# Patient Record
Sex: Female | Born: 1990
Health system: Southern US, Community
[De-identification: ages and names within clinical notes are randomized; demographics above are authoritative.]

## PROBLEM LIST (undated history)

## (undated) DIAGNOSIS — F988 Other specified behavioral and emotional disorders with onset usually occurring in childhood and adolescence: Secondary | ICD-10-CM

## (undated) DIAGNOSIS — K589 Irritable bowel syndrome without diarrhea: Secondary | ICD-10-CM

## (undated) DIAGNOSIS — F320 Major depressive disorder, single episode, mild: Secondary | ICD-10-CM

## (undated) DIAGNOSIS — Z23 Encounter for immunization: Secondary | ICD-10-CM

## (undated) DIAGNOSIS — F32A Depression, unspecified: Secondary | ICD-10-CM

## (undated) DIAGNOSIS — L709 Acne, unspecified: Secondary | ICD-10-CM

## (undated) HISTORY — DX: Acne, unspecified: L70.9

## (undated) HISTORY — DX: Irritable bowel syndrome, unspecified: K58.9

## (undated) HISTORY — DX: Encounter for immunization: Z23

## (undated) HISTORY — DX: Major depressive disorder, single episode, mild: F32.0

## (undated) HISTORY — DX: Other specified behavioral and emotional disorders with onset usually occurring in childhood and adolescence: F98.8

## (undated) HISTORY — DX: Depression, unspecified: F32.A

---

## 2013-05-19 ENCOUNTER — Ambulatory Visit: Payer: Self-pay | Admitting: Family Medicine

## 2014-08-01 IMAGING — US US ABDOMEN LIMITED SLG ORGAN/ASCITES
1 series · 14 of 25 positions shown · non-contrast
Comparison: none

REASON FOR EXAM: gallbladder   liver     abd pain  diarrhea
COMMENTS:

[Series 1: us abdomen limited slg organ/ascites · 0.23mm/px · 14 of 50 slices shown]
[im 1/50]
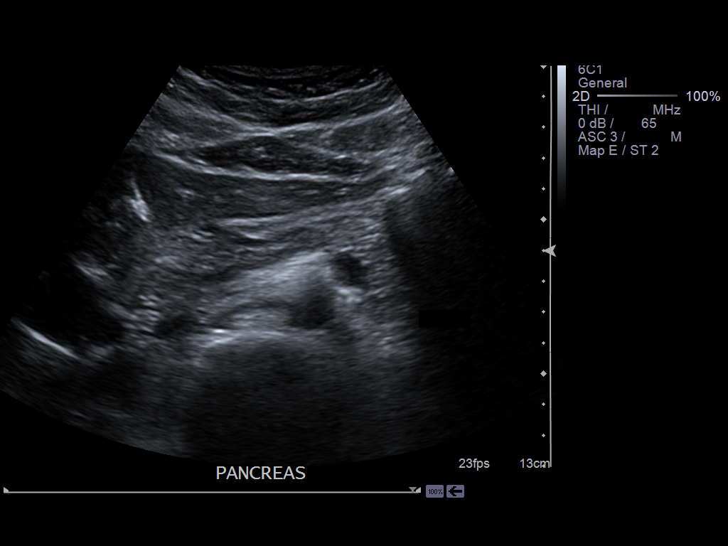
[im 5/50]
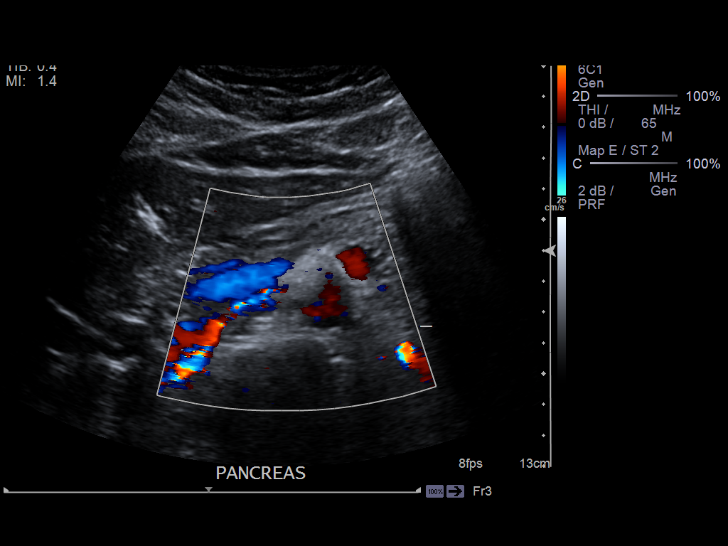
[im 9/50]
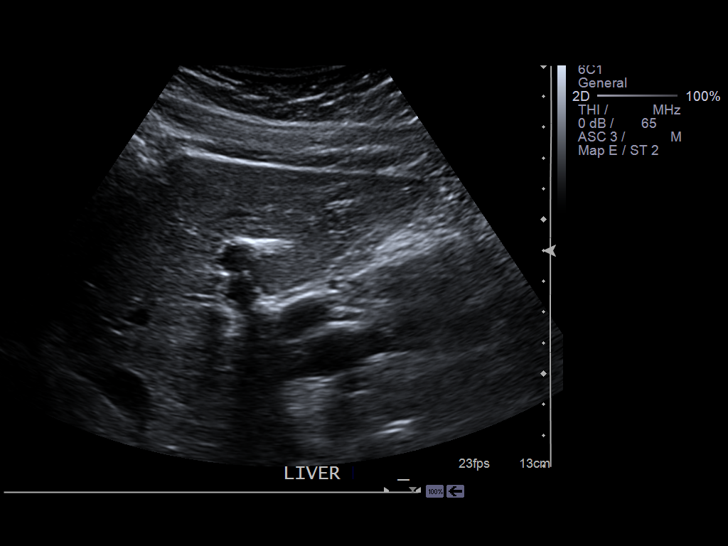
[im 13/50]
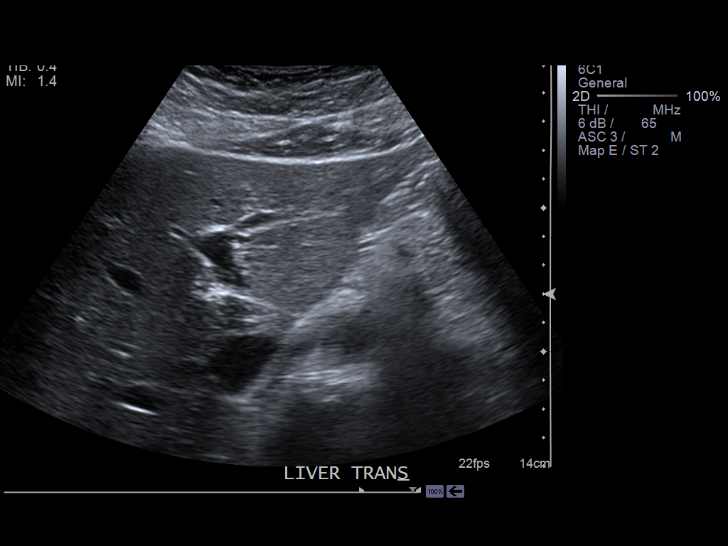
[im 17/50]
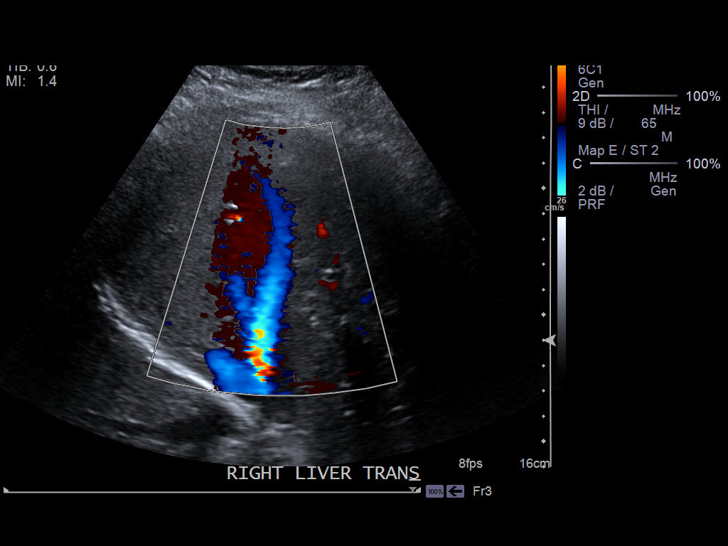
[im 19/50]
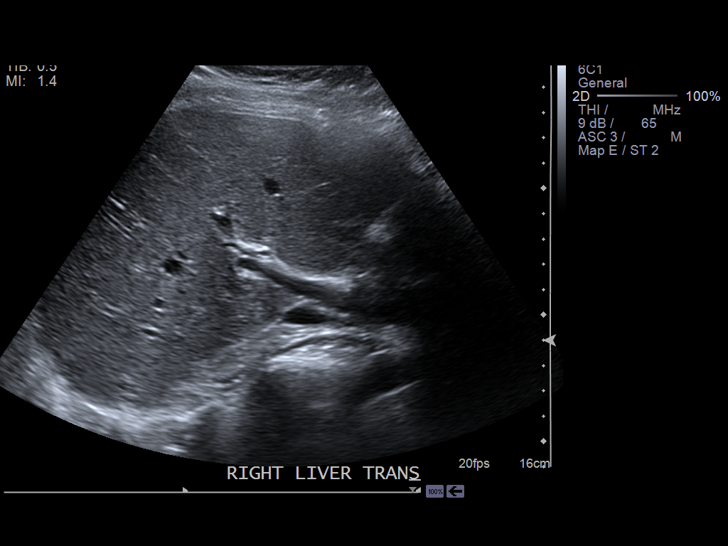
[im 23/50]
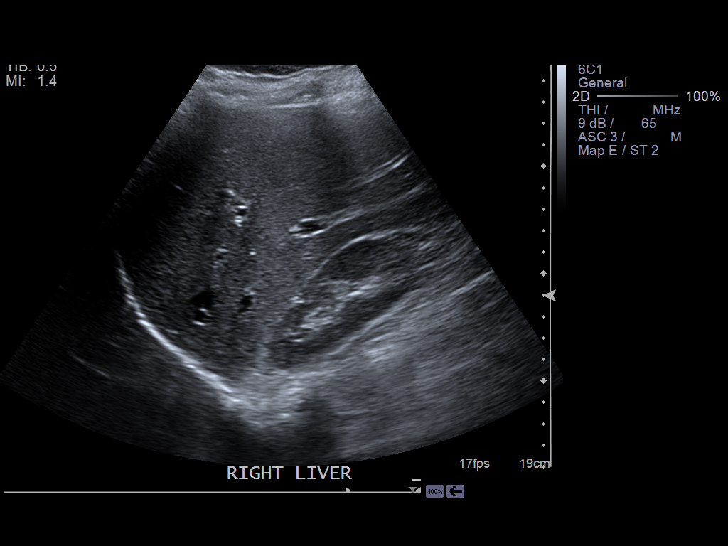
[im 27/50]
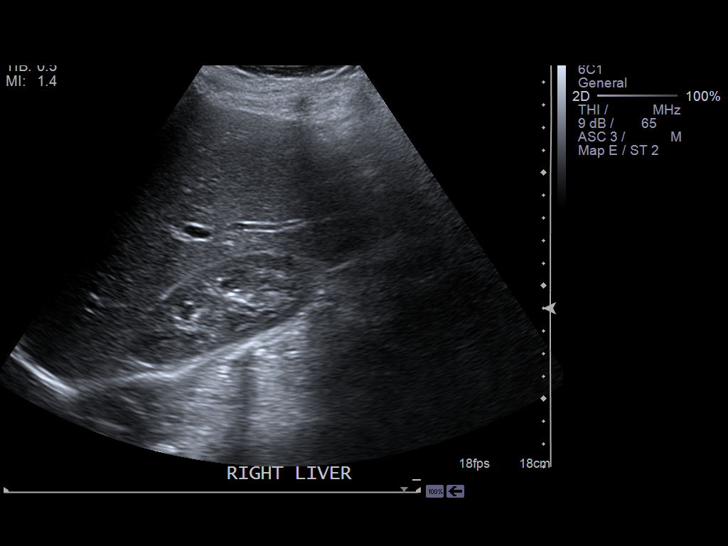
[im 31/50]
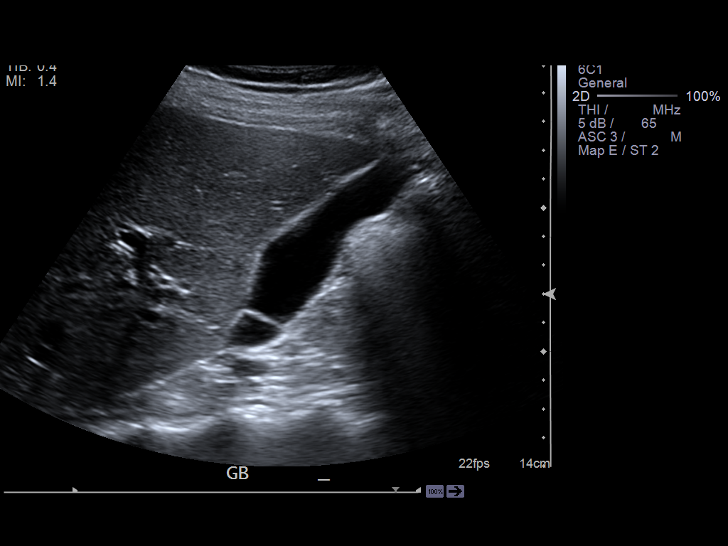
[im 33/50]
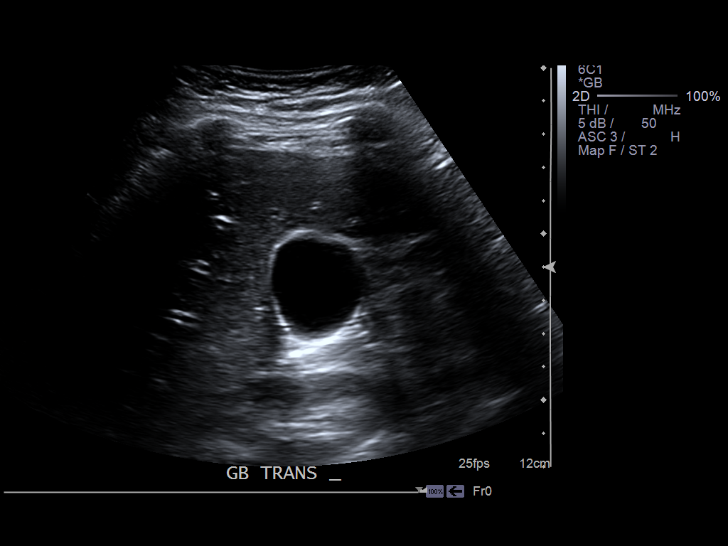
[im 37/50]
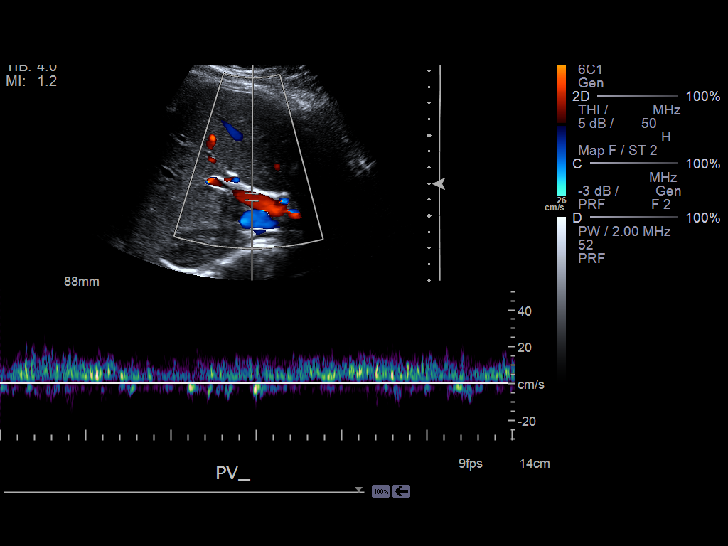
[im 41/50]
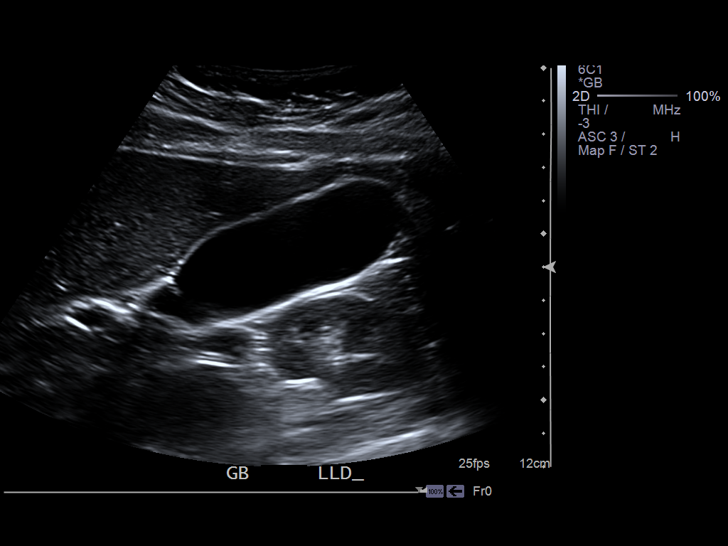
[im 45/50]
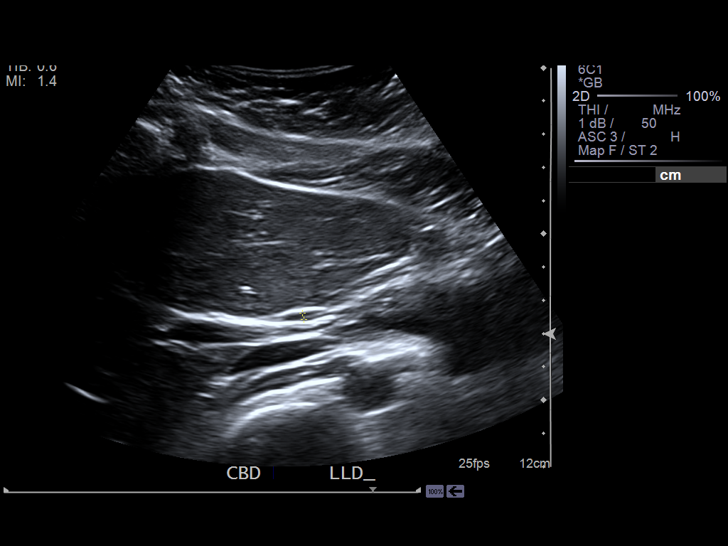
[im 50/50]
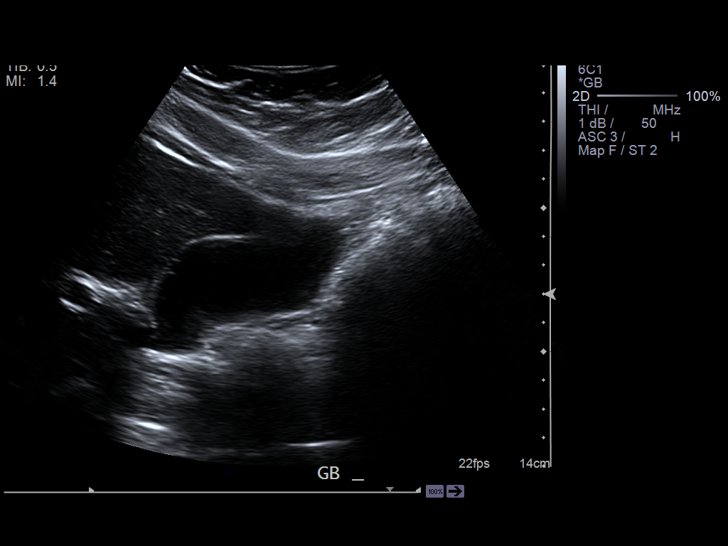

[14 of 25 positions shown; findings below may reference images not displayed]

PROCEDURE:     AMNERIS - AMNERIS ABDOMEN LTD 1 ORGAN OR QUAD  - May 19, 2013  [DATE]

RESULT:     Limited right upper quadrant abdominal sonogram shows the
visualized portions of the pancreas appear normal. The head is not well
seen. The liver echotexture appears to be normal. There is no intrahepatic
biliary ductal dilation. Liver length is 16.71 cm. The gallbladder wall is
2.1 mm thick. There is a negative sonographic Murphy's sign. There is no
cholelithiasis. The common bile duct diameter is 2.5 mm. Portal venous flow
is normal.
IMPRESSION: 1. Limited visualization of the pancreas as described. Otherwise, normal
appearing limited right upper quadrant abdominal sonogram.

[REDACTED]

## 2016-09-16 DIAGNOSIS — J020 Streptococcal pharyngitis: Secondary | ICD-10-CM | POA: Diagnosis not present

## 2016-09-16 DIAGNOSIS — J029 Acute pharyngitis, unspecified: Secondary | ICD-10-CM | POA: Diagnosis not present

## 2016-12-16 DIAGNOSIS — R51 Headache: Secondary | ICD-10-CM | POA: Diagnosis not present

## 2016-12-16 DIAGNOSIS — R42 Dizziness and giddiness: Secondary | ICD-10-CM | POA: Diagnosis not present

## 2016-12-17 DIAGNOSIS — R42 Dizziness and giddiness: Secondary | ICD-10-CM | POA: Diagnosis not present

## 2017-07-03 DIAGNOSIS — J020 Streptococcal pharyngitis: Secondary | ICD-10-CM | POA: Diagnosis not present

## 2018-07-10 HISTORY — PX: OTHER SURGICAL HISTORY: SHX169

## 2018-08-04 ENCOUNTER — Encounter: Payer: Self-pay | Admitting: Obstetrics and Gynecology

## 2018-08-04 ENCOUNTER — Ambulatory Visit (INDEPENDENT_AMBULATORY_CARE_PROVIDER_SITE_OTHER): Payer: 59 | Admitting: Obstetrics and Gynecology

## 2018-08-04 ENCOUNTER — Ambulatory Visit (INDEPENDENT_AMBULATORY_CARE_PROVIDER_SITE_OTHER): Payer: 59

## 2018-08-04 ENCOUNTER — Ambulatory Visit (INDEPENDENT_AMBULATORY_CARE_PROVIDER_SITE_OTHER): Payer: 59 | Admitting: Certified Nurse Midwife

## 2018-08-04 VITALS — BP 120/80 | HR 102 | Ht 67.0 in | Wt 197.0 lb

## 2018-08-04 DIAGNOSIS — O3481 Maternal care for other abnormalities of pelvic organs, first trimester: Secondary | ICD-10-CM | POA: Diagnosis not present

## 2018-08-04 DIAGNOSIS — Z3A01 Less than 8 weeks gestation of pregnancy: Secondary | ICD-10-CM | POA: Diagnosis not present

## 2018-08-04 DIAGNOSIS — O26851 Spotting complicating pregnancy, first trimester: Secondary | ICD-10-CM | POA: Diagnosis not present

## 2018-08-04 DIAGNOSIS — O209 Hemorrhage in early pregnancy, unspecified: Secondary | ICD-10-CM | POA: Diagnosis not present

## 2018-08-04 DIAGNOSIS — N8312 Corpus luteum cyst of left ovary: Secondary | ICD-10-CM | POA: Diagnosis not present

## 2018-08-04 NOTE — Patient Instructions (Signed)
I value your feedback and entrusting us with your care. If you get a [facility] patient survey, I would appreciate you taking the time to let us know about your experience today. Thank you! 

## 2018-08-04 NOTE — Progress Notes (Signed)
    Pt presents for annual exam but has had several pos UPTs at home. LMP 06/22/18. EDD is 6 wks. Pt started having dark spotting today. Has had mild cramping since LMP and it continues today. Cramping is still mild. Pt is not on BC, hasn't been using condoms. Was considering terminating pregnancy anyway and had appt tomorrow, but it was rescheduled for Mon, Dec. 30.  Pt is G0P0.  Check labs today, sched OB u/s this afternoon. Pt to f/u with CLG afterwards.

## 2018-08-04 NOTE — Progress Notes (Signed)
  HPI: 27 year old G1 P0 with LMP 06/22/2018 seen today by PA Copland and she reported being pregnant and having spotting and cramping. An ultrasound was ordered and she presents for the findings.    Ultrasound demonstrates a viable 5wk 6d intauterine pregnancy with FCA=108. There is a small SCH measuring 2.1 x 1.3 x 1.4 cm. The adnexal structures are normal appearing.  She reports that her blood type is O POS, and labs have been drawn to confirm.  PMHx: She  has no past medical history on file. Also,  has no past surgical history on file., family history includes Heart attack in her paternal grandfather; Hypercholesterolemia in her father; Hypertension in her father.,  reports that she has quit smoking. She has never used smokeless tobacco. She reports previous alcohol use. She reports previous drug use.  She currently has no medications in their medication list. Also, has No Known Allergies.  ROS  Objective: Vital Signs: See PA Copland's note Constitutional NAD, Conversant  Skin No rashes, lesions or ulceration.   Extremities: Moves all appropriately.  Neuro: Grossly intact  Psych: Oriented to PPT.  Normal mood. Normal affect.   Assessment:  Viable 5wk 6 day IUP Spotting in first trimester  Plan: Discussed with patient the possibility of miscarriage with first trimester spotting, but that many pregnancies continue on until term.  She is contemplating termination next week.  Will call tomorrow if she is RH negative and needs Rhogam.  Farrel Connersolleen Anden Bartolo, CNM

## 2018-08-05 LAB — ABO/RH: RH TYPE: POSITIVE

## 2018-08-05 LAB — BETA HCG QUANT (REF LAB): hCG Quant: 15884 m[IU]/mL

## 2018-10-19 ENCOUNTER — Ambulatory Visit: Payer: 59 | Admitting: Obstetrics and Gynecology

## 2018-10-25 NOTE — Patient Instructions (Signed)
I value your feedback and entrusting us with your care. If you get a [facility] patient survey, I would appreciate you taking the time to let us know about your experience today. Thank you! 

## 2018-10-25 NOTE — Progress Notes (Signed)
PCP:  Patient, No Pcp Per   Chief Complaint  Patient presents with  . Gynecologic Exam  . Contraception    maybe going back to pills     HPI:      Christina Hutchinson is a 28 y.o. G0P0000 who LMP was Patient's last menstrual period was 10/10/2018 (exact date)., presents today for her annual examination.  Her menses are regular every 28-30 days, lasting 7 days. Dysmenorrhea mild, occurring first 1-2 days of flow. She does not have intermenstrual bleeding.  Sex activity: single partner, contraception - condoms. Would like to restart OCPs. Did Garnette Scheuermann in past. Pt had unintended pregnancy 12/19 with termination. Last Pap: December 03, 2015  Results were: no abnormalities  Hx of STDs: none  There is no FH of breast cancer. There is no FH of ovarian cancer. The patient does do self-breast exams.  Tobacco use: The patient denies current or previous tobacco use. Alcohol use: small amt daily (works at [employer]) No drug use.  Exercise: moderately active  She does get adequate calcium and Vitamin D in her diet. Gardasil completed.  Having increased acne for past yr. Hoping OCPs will improve sx.  Past Medical History:  Diagnosis Date  . Acne   . ADD (attention deficit disorder)   . IBS (irritable bowel syndrome)   . Mild depression (HCC)   . Vaccine for human papilloma virus (HPV) types 6, 11, 16, and 18 administered     Past Surgical History:  Procedure Laterality Date  . elective abortion  07/2018    Family History  Problem Relation Age of Onset  . Hypertension Father   . Hypercholesterolemia Father   . Hyperlipidemia Father   . Heart attack Paternal Grandfather   . Kidney cancer Other 71    Social History   Socioeconomic History  . Marital status: Single    Spouse name: Not on file  . Number of children: Not on file  . Years of education: Not on file  . Highest education level: Not on file  Occupational History  . Not on file  Social Needs  . Financial resource  strain: Not on file  . Food insecurity:    Worry: Not on file    Inability: Not on file  . Transportation needs:    Medical: Not on file    Non-medical: Not on file  Tobacco Use  . Smoking status: Former Games developer  . Smokeless tobacco: Never Used  Substance and Sexual Activity  . Alcohol use: Yes  . Drug use: Not Currently  . Sexual activity: Yes    Birth control/protection: None  Lifestyle  . Physical activity:    Days per week: Not on file    Minutes per session: Not on file  . Stress: Not on file  Relationships  . Social connections:    Talks on phone: Not on file    Gets together: Not on file    Attends religious service: Not on file    Active member of club or organization: Not on file    Attends meetings of clubs or organizations: Not on file    Relationship status: Not on file  . Intimate partner violence:    Fear of current or ex partner: Not on file    Emotionally abused: Not on file    Physically abused: Not on file    Forced sexual activity: Not on file  Other Topics Concern  . Not on file  Social History Narrative  .  Not on file    No outpatient medications prior to visit.   No facility-administered medications prior to visit.     ROS:  Review of Systems  Constitutional: Negative for fatigue, fever and unexpected weight change.  Respiratory: Negative for cough, shortness of breath and wheezing.   Cardiovascular: Negative for chest pain, palpitations and leg swelling.  Gastrointestinal: Negative for blood in stool, constipation, diarrhea, nausea and vomiting.  Endocrine: Negative for cold intolerance, heat intolerance and polyuria.  Genitourinary: Negative for dyspareunia, dysuria, flank pain, frequency, genital sores, hematuria, menstrual problem, pelvic pain, urgency, vaginal bleeding, vaginal discharge and vaginal pain.  Musculoskeletal: Negative for back pain, joint swelling and myalgias.  Skin: Negative for rash.  Neurological: Negative for  dizziness, syncope, light-headedness, numbness and headaches.  Hematological: Negative for adenopathy.  Psychiatric/Behavioral: Negative for agitation, confusion, sleep disturbance and suicidal ideas. The patient is not nervous/anxious.    BREAST: No symptoms   Objective: BP 120/80   Pulse 73   Ht 5\' 7"  (1.702 m)   Wt 197 lb (89.4 kg)   LMP 10/10/2018 (Exact Date)   BMI 30.85 kg/m    Physical Exam Constitutional:      Appearance: She is well-developed.  Genitourinary:     Vulva, vagina, cervix, uterus, right adnexa and left adnexa normal.     No vulval lesion or tenderness noted.     No vaginal discharge, erythema or tenderness.     No cervical polyp.     Uterus is not enlarged or tender.     No right or left adnexal mass present.     Right adnexa not tender.     Left adnexa not tender.  Neck:     Musculoskeletal: Normal range of motion.     Thyroid: No thyromegaly.  Cardiovascular:     Rate and Rhythm: Normal rate and regular rhythm.     Heart sounds: Normal heart sounds. No murmur.  Pulmonary:     Effort: Pulmonary effort is normal.     Breath sounds: Normal breath sounds.  Chest:     Breasts:        Right: No mass, nipple discharge, skin change or tenderness.        Left: No mass, nipple discharge, skin change or tenderness.  Abdominal:     Palpations: Abdomen is soft.     Tenderness: There is no abdominal tenderness. There is no guarding.  Musculoskeletal: Normal range of motion.  Neurological:     General: No focal deficit present.     Mental Status: She is alert and oriented to person, place, and time.     Cranial Nerves: No cranial nerve deficit.  Skin:    General: Skin is warm and dry.  Psychiatric:        Mood and Affect: Mood normal.        Behavior: Behavior normal.        Thought Content: Thought content normal.        Judgment: Judgment normal.  Vitals signs reviewed.     Assessment/Plan: Encounter for annual routine gynecological examination   Cervical cancer screening - Plan: Cytology - PAP  Screening for STD (sexually transmitted disease) - Plan: Cytology - PAP  Encounter for initial prescription of contraceptive pills - OCP start with next menses. Condoms.  - Plan: desogestrel-ethinyl estradiol (KARIVA) 0.15-0.02/0.01 MG (21/5) tablet  Meds ordered this encounter  Medications  . desogestrel-ethinyl estradiol (KARIVA) 0.15-0.02/0.01 MG (21/5) tablet    Sig: Take 1 tablet by  mouth daily.    Dispense:  3 Package    Refill:  3    Order Specific Question:   Supervising Provider    Answer:   Nadara Mustard [[id number]]             GYN counsel adequate intake of calcium and vitamin D, diet and exercise     F/U  Return in about 1 year (around 10/26/2019).  Quinita Kostelecky B. Rossi Burdo, PA-C 10/26/2018 10:48 AM

## 2018-10-26 ENCOUNTER — Other Ambulatory Visit (HOSPITAL_COMMUNITY)
Admission: RE | Admit: 2018-10-26 | Discharge: 2018-10-26 | Disposition: A | Discharge status: Home / Self Care | Payer: BLUE CROSS/BLUE SHIELD | Source: Ambulatory Visit | Attending: Obstetrics and Gynecology | Admitting: Obstetrics and Gynecology

## 2018-10-26 ENCOUNTER — Ambulatory Visit (INDEPENDENT_AMBULATORY_CARE_PROVIDER_SITE_OTHER): Payer: BLUE CROSS/BLUE SHIELD | Admitting: Obstetrics and Gynecology

## 2018-10-26 ENCOUNTER — Encounter: Payer: Self-pay | Admitting: Obstetrics and Gynecology

## 2018-10-26 ENCOUNTER — Other Ambulatory Visit: Payer: Self-pay

## 2018-10-26 VITALS — BP 120/80 | HR 73 | Ht 67.0 in | Wt 197.0 lb

## 2018-10-26 DIAGNOSIS — Z01419 Encounter for gynecological examination (general) (routine) without abnormal findings: Secondary | ICD-10-CM

## 2018-10-26 DIAGNOSIS — Z113 Encounter for screening for infections with a predominantly sexual mode of transmission: Secondary | ICD-10-CM

## 2018-10-26 DIAGNOSIS — Z124 Encounter for screening for malignant neoplasm of cervix: Secondary | ICD-10-CM | POA: Diagnosis not present

## 2018-10-26 DIAGNOSIS — Z30011 Encounter for initial prescription of contraceptive pills: Secondary | ICD-10-CM

## 2018-10-26 MED ORDER — DESOGESTREL-ETHINYL ESTRADIOL 0.15-0.02/0.01 MG (21/5) PO TABS: 1.0000 | ORAL_TABLET | Freq: Every day | ORAL | 3 refills | Status: DC

## 2018-10-27 LAB — CYTOLOGY - PAP
Chlamydia: NEGATIVE
Diagnosis: NEGATIVE
Neisseria Gonorrhea: NEGATIVE

## 2019-10-09 ENCOUNTER — Other Ambulatory Visit: Payer: Self-pay | Admitting: Obstetrics and Gynecology

## 2019-10-09 DIAGNOSIS — Z30011 Encounter for initial prescription of contraceptive pills: Secondary | ICD-10-CM

## 2019-10-18 ENCOUNTER — Other Ambulatory Visit: Payer: Self-pay | Admitting: Obstetrics and Gynecology

## 2019-10-18 DIAGNOSIS — Z30011 Encounter for initial prescription of contraceptive pills: Secondary | ICD-10-CM

## 2019-10-19 ENCOUNTER — Telehealth: Payer: Self-pay

## 2019-10-19 DIAGNOSIS — Z30011 Encounter for initial prescription of contraceptive pills: Secondary | ICD-10-CM

## 2019-10-19 NOTE — Telephone Encounter (Signed)
Pt calling for refill of bc to [facility]; does not have ins right now and doesn't want to come in for annual now - just wants bc.  [phone]

## 2019-10-20 MED ORDER — DESOGESTREL-ETHINYL ESTRADIOL 0.15-0.02/0.01 MG (21/5) PO TABS: 1.0000 | ORAL_TABLET | Freq: Every day | ORAL | 0 refills | Status: DC

## 2019-10-20 NOTE — Telephone Encounter (Signed)
Left detailed msg for pt that refills have been sent in and she can contact [facility] at [phone] for annual and pap since no insurance.

## 2019-10-20 NOTE — Telephone Encounter (Signed)
Yes, send in 3 months. She can also go through [facility] for annual and pap since no insurance. Pls discuss with pt. Thx

## 2020-01-07 ENCOUNTER — Other Ambulatory Visit: Payer: Self-pay | Admitting: Obstetrics and Gynecology

## 2020-01-07 DIAGNOSIS — Z30011 Encounter for initial prescription of contraceptive pills: Secondary | ICD-10-CM

## 2020-01-14 ENCOUNTER — Other Ambulatory Visit: Payer: Self-pay | Admitting: Obstetrics and Gynecology

## 2020-01-14 DIAGNOSIS — Z30011 Encounter for initial prescription of contraceptive pills: Secondary | ICD-10-CM

## 2020-02-13 ENCOUNTER — Encounter: Payer: Self-pay | Admitting: Physician Assistant

## 2020-02-13 ENCOUNTER — Other Ambulatory Visit: Payer: Self-pay

## 2020-02-13 ENCOUNTER — Ambulatory Visit (LOCAL_COMMUNITY_HEALTH_CENTER): Payer: Self-pay | Admitting: Physician Assistant

## 2020-02-13 VITALS — BP 121/80 | Ht 68.5 in | Wt 206.0 lb

## 2020-02-13 DIAGNOSIS — Z3041 Encounter for surveillance of contraceptive pills: Secondary | ICD-10-CM

## 2020-02-13 DIAGNOSIS — Z0000 Encounter for general adult medical examination without abnormal findings: Secondary | ICD-10-CM

## 2020-02-13 DIAGNOSIS — Z3009 Encounter for other general counseling and advice on contraception: Secondary | ICD-10-CM

## 2020-02-13 MED ORDER — DESOGESTREL-ETHINYL ESTRADIOL 0.15-0.02/0.01 MG (21/5) PO TABS: 1.0000 | ORAL_TABLET | Freq: Every day | ORAL | 3 refills | Status: DC

## 2020-02-13 NOTE — Progress Notes (Signed)
Family Planning Visit- Initial Visit  Subjective:  Christina Hutchinson is a 29 y.o.  G1P0010   being seen today for an initial well woman visit and to discuss family planning options.  She is currently using OCs/COCs for pregnancy prevention. Patient reports she does not want a pregnancy in the next year.  Patient has the following medical conditions does not have a problem list on file.  Chief Complaint  Patient presents with  . Contraception    PE and OCs    Patient reports that she has used the same OC since she was 17 and never had any problems.  Wants to continue with this OC.  States that she does not currently have any insurance, so was not able to afford a visit with her Gyn.  States that she has had one partner for 6 years.  Reports that she takes MVI daily.  Patient denies any concerns today.  Declines blood work, GC/Chlamydia testing and MVI today.   Body mass index is 30.87 kg/m. - Patient is eligible for diabetes screening based on BMI and age >39?  not applicable HA1C ordered? not applicable  Patient reports 1 of partners in last year. Desires STI screening?  No - declines.  Has patient been screened once for HCV in the past?  No  No results found for: HCVAB  Does the patient have current of drug use, have a partner with drug use, and/or has been incarcerated since last result? No  If yes-- Screen for HCV through [facility]   Does the patient meet criteria for HBV testing? No  Criteria:  -Household, sexual or needle sharing contact with HBV -History of drug use -HIV positive -Those with known Hep C   Health Maintenance Due  Topic Date Due  . Hepatitis C Screening  Never done  . COVID-19 Vaccine (1) Never done  . HIV Screening  Never done    Review of Systems  All other systems reviewed and are negative.   The following portions of the patient's history were reviewed and updated as appropriate: allergies, current medications, past family history, past  medical history, past social history, past surgical history and problem list. Problem list updated.   See flowsheet for other program required questions.  Objective:   Vitals:   02/13/20 0836  BP: 121/80  Weight: 206 lb (93.4 kg)  Height: 5' 8.5" (1.74 m)    Physical Exam Vitals reviewed.  Constitutional:      General: She is not in acute distress.    Appearance: Normal appearance.  HENT:     Head: Normocephalic and atraumatic.  Eyes:     Conjunctiva/sclera: Conjunctivae normal.  Neck:     Thyroid: No thyroid mass, thyromegaly or thyroid tenderness.  Cardiovascular:     Rate and Rhythm: Normal rate and regular rhythm.  Pulmonary:     Effort: Pulmonary effort is normal.     Breath sounds: Normal breath sounds.  Abdominal:     Palpations: Abdomen is soft. There is no mass.     Tenderness: There is no abdominal tenderness. There is no guarding or rebound.  Musculoskeletal:     Cervical back: Neck supple.  Lymphadenopathy:     Cervical: No cervical adenopathy.  Skin:    General: Skin is warm and dry.     Findings: No bruising, erythema, lesion or rash.     Comments: tanned  Neurological:     Mental Status: She is alert and oriented to person,  place, and time.  Psychiatric:        Mood and Affect: Mood normal.        Thought Content: Thought content normal.        Judgment: Judgment normal.       Assessment and Plan:  Christina Hutchinson is a 29 y.o. female presenting to the [facility] for an initial well woman exam/family planning visit  Contraception counseling: Reviewed all forms of birth control options in the tiered based approach. available including abstinence; over the counter/barrier methods; hormonal contraceptive medication including pill, patch, ring, injection,contraceptive implant, ECP; hormonal and nonhormonal IUDs; permanent sterilization options including vasectomy and the various tubal sterilization modalities. Risks, benefits,  and typical effectiveness rates were reviewed.  Questions were answered.  Written information was also given to the patient to review.  Patient desires to continue with her current OC, this was prescribed for patient. She will follow up in  1 year and prn for surveillance.  She was told to call with any further questions, or with any concerns about this method of contraception.  Emphasized use of condoms 100% of the time for STI prevention.  Patient was not a candidate for ECP today.  1. Encounter for counseling regarding contraception Patient into clinic to continue with her OCs. Rec condoms with all sex and especially if late OCs. Counseled patient to call clinic if paying for OCs is too expensive and we can discuss further a change to a different pill.  2. Well woman exam (no gynecological exam) Reviewed with patient healthy habits to maintain healthy BMI. Enc to continue with MVI daily, can RTC for supply if needed. Enc to establish with/follow up with PCP for illness and any primary care concerns.  3. Surveillance of previously prescribed contraceptive pill Per patient preference, e-Rx sent to pharmacy for Kariva 28d 1 po daily 3 month supply with refills x3. Counseled patient to call back if she has any trouble with pharmacy not having Rx. - desogestrel-ethinyl estradiol (KARIVA) 0.15-0.02/0.01 MG (21/5) tablet; Take 1 tablet by mouth daily.  Dispense: 84 tablet; Refill: 3     Return in about 1 year (around 02/12/2021) for annal exam and prn.  No future appointments.  Matt Holmes, PA

## 2021-01-17 ENCOUNTER — Other Ambulatory Visit: Payer: Self-pay | Admitting: Physician Assistant

## 2021-01-17 DIAGNOSIS — Z3041 Encounter for surveillance of contraceptive pills: Secondary | ICD-10-CM

## 2021-01-17 NOTE — Telephone Encounter (Signed)
Per chart review, patient had IP appt on 02/13/2020 and Rx with refills for 1 year given at that time.  Will OK one refill (3 month supply) for patient.  Patient will need to RTC for annual visit prior to further refills.

## 2021-04-09 ENCOUNTER — Other Ambulatory Visit: Payer: Self-pay | Admitting: Physician Assistant

## 2021-04-09 DIAGNOSIS — Z3041 Encounter for surveillance of contraceptive pills: Secondary | ICD-10-CM

## 2021-04-09 NOTE — Telephone Encounter (Signed)
Patient with IP visit in 02/2020 with refills for 1 year.  Additional refills of 3 month supply approved in 01/2021.  Patient needs in-person visit prior to further refills.

## 2021-04-23 ENCOUNTER — Other Ambulatory Visit: Payer: Self-pay | Admitting: Obstetrics and Gynecology

## 2021-04-23 ENCOUNTER — Encounter: Payer: Self-pay | Admitting: Obstetrics and Gynecology

## 2021-04-23 DIAGNOSIS — Z3041 Encounter for surveillance of contraceptive pills: Secondary | ICD-10-CM

## 2021-04-23 MED ORDER — DESOGESTREL-ETHINYL ESTRADIOL 0.15-0.02/0.01 MG (21/5) PO TABS: 1.0000 | ORAL_TABLET | Freq: Every day | ORAL | 0 refills | Status: DC

## 2021-04-23 NOTE — Progress Notes (Signed)
Rx RF OCP till annual, last seen at [facility]

## 2021-04-30 ENCOUNTER — Encounter: Payer: Self-pay | Admitting: Physician Assistant

## 2021-05-20 NOTE — Progress Notes (Signed)
PCP:  Patient, No Pcp Per (Inactive)   Chief Complaint  Patient presents with   Gynecologic Exam    Missed Sept cycle, neg UPTs at home     HPI:      Christina Hutchinson is a 30 y.o. G0P0000 who LMP was Patient's last menstrual period was 03/16/2021 (approximate)., presents today for her annual examination.  Her menses are regular every 28-30 days, lasting 4-5 days. Dysmenorrhea mild, occurring first 1-2 days of flow. She does not have intermenstrual bleeding. Had 1 day spotting only 9/22 menses with neg UPT.   Sex activity: single partner, contraception - OCPs. Pt had unintended pregnancy 12/19 with termination. Last Pap: 10/26/18 Results were: no abnormalities  Hx of STDs: none  There is no FH of breast cancer. There is no FH of ovarian cancer. The patient does do self-breast exams.  Tobacco use: The patient denies current or previous tobacco use. Alcohol use: small amt daily (works at [employer]) No drug use.  Exercise: moderately active  She does get adequate calcium and Vitamin D in her diet. Gardasil completed. Has had wt gain, now has desk job. Tries a diet for a few wks but gets frustrated and then eats. Is an "emotional/stress eater", likes carbs. Very frustrated with wt. Has started exercising 4-5 times wkly. Drinks alcohol a few days wkly. Doesn't drink sugared drinks.    Past Medical History:  Diagnosis Date   Acne    ADD (attention deficit disorder)    IBS (irritable bowel syndrome)    Mild depression    Vaccine for human papilloma virus (HPV) types 6, 11, 16, and 18 administered     Past Surgical History:  Procedure Laterality Date   elective abortion  07/2018    Family History  Problem Relation Age of Onset   Hypertension Father    Hypercholesterolemia Father    Hyperlipidemia Father    Heart attack Paternal Grandfather    Kidney cancer Other 88    Social History   Socioeconomic History   Marital status: Unknown    Spouse name: Not on file    Number of children: Not on file   Years of education: Not on file   Highest education level: Not on file  Occupational History   Not on file  Tobacco Use   Smoking status: Never   Smokeless tobacco: Never  Vaping Use   Vaping Use: Never used  Substance and Sexual Activity   Alcohol use: Yes    Comment: socially   Drug use: Not Currently   Sexual activity: Yes    Birth control/protection: OCP  Other Topics Concern   Not on file  Social History Narrative   ** Merged History Encounter **       Social Determinants of Health   Financial Resource Strain: Not on file  Food Insecurity: Not on file  Transportation Needs: Not on file  Physical Activity: Not on file  Stress: Not on file  Social Connections: Not on file  Intimate Partner Violence: Not on file    Outpatient Medications Prior to Visit  Medication Sig Dispense Refill   desogestrel-ethinyl estradiol (KARIVA) 0.15-0.02/0.01 MG (21/5) tablet Take 1 tablet by mouth daily. 84 tablet 0   No facility-administered medications prior to visit.    ROS:  Review of Systems  Constitutional:  Negative for fatigue, fever and unexpected weight change.  Respiratory:  Negative for cough, shortness of breath and wheezing.   Cardiovascular:  Negative for chest pain, palpitations  and leg swelling.  Gastrointestinal:  Negative for blood in stool, constipation, diarrhea, nausea and vomiting.  Endocrine: Negative for cold intolerance, heat intolerance and polyuria.  Genitourinary:  Negative for dyspareunia, dysuria, flank pain, frequency, genital sores, hematuria, menstrual problem, pelvic pain, urgency, vaginal bleeding, vaginal discharge and vaginal pain.  Musculoskeletal:  Negative for back pain, joint swelling and myalgias.  Skin:  Negative for rash.  Neurological:  Negative for dizziness, syncope, light-headedness, numbness and headaches.  Hematological:  Negative for adenopathy.  Psychiatric/Behavioral:  Positive for agitation.  Negative for confusion, sleep disturbance and suicidal ideas. The patient is not nervous/anxious.   BREAST: No symptoms   Objective: BP 130/90   Ht 5\' 7"  (1.702 m)   Wt 217 lb (98.4 kg)   LMP 03/16/2021 (Approximate)   BMI 33.99 kg/m    Physical Exam Constitutional:      Appearance: She is well-developed.  Genitourinary:     Vulva normal.     Right Labia: No rash, tenderness or lesions.    Left Labia: No tenderness, lesions or rash.    No vaginal discharge, erythema or tenderness.      Right Adnexa: not tender and no mass present.    Left Adnexa: not tender and no mass present.    No cervical friability or polyp.     Uterus is not enlarged or tender.  Breasts:    Right: No mass, nipple discharge, skin change or tenderness.     Left: No mass, nipple discharge, skin change or tenderness.  Neck:     Thyroid: No thyromegaly.  Cardiovascular:     Rate and Rhythm: Normal rate and regular rhythm.     Heart sounds: Normal heart sounds. No murmur heard. Pulmonary:     Effort: Pulmonary effort is normal.     Breath sounds: Normal breath sounds.  Abdominal:     Palpations: Abdomen is soft.     Tenderness: There is no abdominal tenderness. There is no guarding or rebound.  Musculoskeletal:        General: Normal range of motion.     Cervical back: Normal range of motion.  Lymphadenopathy:     Cervical: No cervical adenopathy.  Neurological:     General: No focal deficit present.     Mental Status: She is alert and oriented to person, place, and time.     Cranial Nerves: No cranial nerve deficit.  Skin:    General: Skin is warm and dry.  Psychiatric:        Mood and Affect: Mood normal.        Behavior: Behavior normal.        Thought Content: Thought content normal.        Judgment: Judgment normal.  Vitals reviewed.   Results for orders placed or performed in visit on 05/21/21 (from the past 24 hour(s))  POCT urine pregnancy     Status: Normal   Collection Time:  05/21/21 12:04 PM  Result Value Ref Range   Preg Test, Ur Negative Negative     Assessment/Plan: Encounter for annual routine gynecological examination  Cervical cancer screening - Plan: Cytology - PAP  Encounter for surveillance of contraceptive pills - Plan: desogestrel-ethinyl estradiol (KARIVA) 0.15-0.02/0.01 MG (21/5) tablet; OCP RF  Missed menses--neg UPT today. Reassurance. Check TSH anyway. Reassurance if neg. F/u prn.   Weight gain - Plan: Comprehensive metabolic panel, TSH; check labs. If neg, discussed diet/exercise changes for wt loss.   BMI 33.0-33.9,adult - Plan: Comprehensive metabolic panel,  TSH  Thyroid disorder screening - Plan: TSH  Meds ordered this encounter  Medications   desogestrel-ethinyl estradiol (KARIVA) 0.15-0.02/0.01 MG (21/5) tablet    Sig: Take 1 tablet by mouth daily.    Dispense:  84 tablet    Refill:  3    Order Specific Question:   Supervising Provider    Answer:   Nadara Mustard [[id number]]              GYN counsel adequate intake of calcium and vitamin D, diet and exercise     F/U  Return in about 1 year (around 05/21/2022).  Kaiyu Mirabal B. Jamesina Gaugh, PA-C 05/21/2021 12:05 PM

## 2021-05-21 ENCOUNTER — Other Ambulatory Visit: Payer: Self-pay

## 2021-05-21 ENCOUNTER — Ambulatory Visit (INDEPENDENT_AMBULATORY_CARE_PROVIDER_SITE_OTHER): Payer: Medicaid Other | Admitting: Obstetrics and Gynecology

## 2021-05-21 ENCOUNTER — Other Ambulatory Visit (HOSPITAL_COMMUNITY)
Admission: RE | Admit: 2021-05-21 | Discharge: 2021-05-21 | Disposition: A | Discharge status: Home / Self Care | Payer: BLUE CROSS/BLUE SHIELD | Source: Ambulatory Visit | Attending: Obstetrics and Gynecology | Admitting: Obstetrics and Gynecology

## 2021-05-21 ENCOUNTER — Encounter: Payer: Self-pay | Admitting: Obstetrics and Gynecology

## 2021-05-21 VITALS — BP 130/90 | Ht 67.0 in | Wt 217.0 lb

## 2021-05-21 DIAGNOSIS — Z3041 Encounter for surveillance of contraceptive pills: Secondary | ICD-10-CM

## 2021-05-21 DIAGNOSIS — N926 Irregular menstruation, unspecified: Secondary | ICD-10-CM

## 2021-05-21 DIAGNOSIS — Z01419 Encounter for gynecological examination (general) (routine) without abnormal findings: Secondary | ICD-10-CM | POA: Diagnosis not present

## 2021-05-21 DIAGNOSIS — R635 Abnormal weight gain: Secondary | ICD-10-CM

## 2021-05-21 DIAGNOSIS — Z124 Encounter for screening for malignant neoplasm of cervix: Secondary | ICD-10-CM | POA: Insufficient documentation

## 2021-05-21 DIAGNOSIS — Z1329 Encounter for screening for other suspected endocrine disorder: Secondary | ICD-10-CM

## 2021-05-21 DIAGNOSIS — Z3202 Encounter for pregnancy test, result negative: Secondary | ICD-10-CM

## 2021-05-21 DIAGNOSIS — Z6833 Body mass index (BMI) 33.0-33.9, adult: Secondary | ICD-10-CM | POA: Diagnosis not present

## 2021-05-21 LAB — POCT URINE PREGNANCY: Preg Test, Ur: NEGATIVE

## 2021-05-21 MED ORDER — DESOGESTREL-ETHINYL ESTRADIOL 0.15-0.02/0.01 MG (21/5) PO TABS: 1.0000 | ORAL_TABLET | Freq: Every day | ORAL | 3 refills | Status: DC

## 2021-05-21 NOTE — Patient Instructions (Signed)
I value your feedback and you entrusting us with your care. If you get a [facility] patient survey, I would appreciate you taking the time to let us know about your experience today. Thank you! ? ? ?

## 2021-05-22 LAB — COMPREHENSIVE METABOLIC PANEL
ALT: 25 IU/L (ref 0–32)
AST: 24 IU/L (ref 0–40)
Albumin/Globulin Ratio: 1.9 (ref 1.2–2.2)
Albumin: 4.8 g/dL (ref 3.9–5.0)
Alkaline Phosphatase: 65 IU/L (ref 44–121)
BUN/Creatinine Ratio: 15 (ref 9–23)
BUN: 11 mg/dL (ref 6–20)
Bilirubin Total: 0.3 mg/dL (ref 0.0–1.2)
CO2: 20 mmol/L (ref 20–29)
Calcium: 9.9 mg/dL (ref 8.7–10.2)
Chloride: 103 mmol/L (ref 96–106)
Creatinine, Ser: 0.75 mg/dL (ref 0.57–1.00)
Globulin, Total: 2.5 g/dL (ref 1.5–4.5)
Glucose: 83 mg/dL (ref 70–99)
Potassium: 4.4 mmol/L (ref 3.5–5.2)
Sodium: 139 mmol/L (ref 134–144)
Total Protein: 7.3 g/dL (ref 6.0–8.5)
eGFR: 111 mL/min/{1.73_m2} (ref >59)

## 2021-05-22 LAB — TSH: TSH: 1.36 u[IU]/mL (ref 0.450–4.500)

## 2021-05-23 LAB — CYTOLOGY - PAP: Diagnosis: NEGATIVE

## 2021-05-27 ENCOUNTER — Ambulatory Visit: Payer: Self-pay | Admitting: Obstetrics and Gynecology

## 2021-11-17 ENCOUNTER — Encounter: Payer: Self-pay | Admitting: Obstetrics and Gynecology

## 2022-04-22 NOTE — Progress Notes (Signed)
Patient, No Pcp Per   Chief Complaint  Patient presents with   Vaginitis    Pt having some vaginal discharge and burning with intercourse.     HPI:      [name] is a 31 y.o. G1P0010 whose LMP was Patient's last menstrual period was 04/01/2022 (exact date)., presents today for increased vag d/c and dyspareunia with burning sensation for a couple wks. Had sx a month ago and treated with monistat-1 with some relief, but sx have recurred. No odor. No burning if not sexually active. Hx of yeast vag 8/22. No recent abx use. Has been in damp underwear this summer. No urin sx, no LBP, fevers.  Pt with hx of dyspareunia/burning off and on in past, not just past month. Pt has tried lubricants which cause the burning, too. Uses natural soap and partner uses man-type soap. No new partners.  Is on OCPs, had pink d/c a few days ago, most likely with late/missed pills.   Past Medical History:  Diagnosis Date   Acne    ADD (attention deficit disorder)    IBS (irritable bowel syndrome)    Mild depression    Vaccine for human papilloma virus (HPV) types 6, 11, 16, and 18 administered     Past Surgical History:  Procedure Laterality Date   elective abortion  07/2018    Family History  Problem Relation Age of Onset   Hypertension Father    Hypercholesterolemia Father    Hyperlipidemia Father    Heart attack Paternal Grandfather    Kidney cancer Other 42    Social History   Socioeconomic History   Marital status: Unknown    Spouse name: Not on file   Number of children: Not on file   Years of education: Not on file   Highest education level: Not on file  Occupational History   Not on file  Tobacco Use   Smoking status: Never   Smokeless tobacco: Never  Vaping Use   Vaping Use: Never used  Substance and Sexual Activity   Alcohol use: Yes    Comment: socially   Drug use: Not Currently   Sexual activity: Yes    Birth control/protection: OCP  Other Topics Concern    Not on file  Social History Narrative   ** Merged History Encounter **       Social Determinants of Health   Financial Resource Strain: Not on file  Food Insecurity: Not on file  Transportation Needs: Not on file  Physical Activity: Not on file  Stress: Not on file  Social Connections: Not on file  Intimate Partner Violence: Not on file    Outpatient Medications Prior to Visit  Medication Sig Dispense Refill   desogestrel-ethinyl estradiol (KARIVA) 0.15-0.02/0.01 MG (21/5) tablet Take 1 tablet by mouth daily. 84 tablet 3   No facility-administered medications prior to visit.      ROS:  Review of Systems  Constitutional:  Negative for fever.  Gastrointestinal:  Negative for blood in stool, constipation, diarrhea, nausea and vomiting.  Genitourinary:  Positive for dyspareunia and vaginal discharge. Negative for dysuria, flank pain, frequency, hematuria, urgency, vaginal bleeding and vaginal pain.  Musculoskeletal:  Negative for back pain.  Skin:  Negative for rash.   BREAST: No symptoms   OBJECTIVE:   Vitals:  BP 126/84   Ht 5\' 8"  (1.727 m)   Wt 200 lb (90.7 kg)   LMP 04/01/2022 (Exact Date)   BMI 30.41 kg/m  Physical Exam Vitals reviewed.  Constitutional:      Appearance: She is well-developed.  Pulmonary:     Effort: Pulmonary effort is normal.  Genitourinary:    General: Normal vulva.     Pubic Area: No rash.      Labia:        Right: No rash, tenderness or lesion.        Left: No rash, tenderness or lesion.      Vagina: Vaginal discharge present. No erythema or tenderness.     Cervix: Normal.     Uterus: Normal. Not enlarged and not tender.      Adnexa: Right adnexa normal and left adnexa normal.       Right: No mass or tenderness.         Left: No mass or tenderness.       Comments: SLIGHT ERYTHEMA EXT VAG OPENING Musculoskeletal:        General: Normal range of motion.     Cervical back: Normal range of motion.  Skin:    General: Skin is warm  and dry.  Neurological:     General: No focal deficit present.     Mental Status: She is alert and oriented to person, place, and time.  Psychiatric:        Mood and Affect: Mood normal.        Behavior: Behavior normal.        Thought Content: Thought content normal.        Judgment: Judgment normal.     Results: Results for orders placed or performed in visit on 04/23/22 (from the past 24 hour(s))  POCT Wet Prep with KOH     Status: Abnormal   Collection Time: 04/23/22 11:37 AM  Result Value Ref Range   Trichomonas, UA Negative    Clue Cells Wet Prep HPF POC neg    Epithelial Wet Prep HPF POC     Yeast Wet Prep HPF POC pos    Bacteria Wet Prep HPF POC     RBC Wet Prep HPF POC     WBC Wet Prep HPF POC     KOH Prep POC Negative Negative     Assessment/Plan: Candidal vaginitis - Plan: fluconazole (DIFLUCAN) 150 MG tablet, POCT Wet Prep with KOH; pos sx and wet prep. Rx diflucan, f/u prn.   Dyspareunia in female--most likely yeast vag this time. Could be soap residue in past so she and partner to use dove sens skin soap, coconut oil as lubricant. F/u prn    Meds ordered this encounter  Medications   fluconazole (DIFLUCAN) 150 MG tablet    Sig: Take 1 tablet (150 mg total) by mouth once for 1 dose. May repeat in 3 days if still having symptoms    Dispense:  2 tablet    Refill:  0    Order Specific Question:   Supervising Provider    Answer:   Hildred Laser [[id number]]      Return if symptoms worsen or fail to improve.  Margaret Cockerill B. Catina Nuss, PA-C 04/23/2022 11:39 AM

## 2022-04-23 ENCOUNTER — Encounter: Payer: Self-pay | Admitting: Obstetrics and Gynecology

## 2022-04-23 ENCOUNTER — Ambulatory Visit (INDEPENDENT_AMBULATORY_CARE_PROVIDER_SITE_OTHER): Payer: Medicaid Other | Admitting: Obstetrics and Gynecology

## 2022-04-23 VITALS — BP 126/84 | Ht 68.0 in | Wt 200.0 lb

## 2022-04-23 DIAGNOSIS — N941 Unspecified dyspareunia: Secondary | ICD-10-CM

## 2022-04-23 DIAGNOSIS — B3731 Acute candidiasis of vulva and vagina: Secondary | ICD-10-CM | POA: Insufficient documentation

## 2022-04-23 LAB — POCT WET PREP WITH KOH
Clue Cells Wet Prep HPF POC: NEGATIVE
KOH Prep POC: NEGATIVE
Trichomonas, UA: NEGATIVE
Yeast Wet Prep HPF POC: POSITIVE

## 2022-04-23 MED ORDER — FLUCONAZOLE 150 MG PO TABS: 150.0000 mg | ORAL_TABLET | Freq: Once | ORAL | 0 refills | Status: AC

## 2022-05-27 NOTE — Progress Notes (Unsigned)
PCP:  Patient, No Pcp Per   No chief complaint on file.    HPI:      [name] is a 31 y.o. G0P0000 who LMP was No LMP recorded., presents today for her annual examination.  Her menses are regular every 28-30 days, lasting 4-5 days. Dysmenorrhea mild, occurring first 1-2 days of flow. She does not have intermenstrual bleeding. Had 1 day spotting only 9/22 menses with neg UPT.   Sex activity: single partner, contraception - OCPs. Pt had unintended pregnancy 12/19 with termination. Last Pap: 05/21/21 Results were: no abnormalities  Hx of STDs: none  There is no FH of breast cancer. There is no FH of ovarian cancer. The patient does do self-breast exams.  Tobacco use: The patient denies current or previous tobacco use. Alcohol use: small amt daily (works at [employer]) No drug use.  Exercise: moderately active  She does get adequate calcium and Vitamin D in her diet. Gardasil completed. Has had wt gain, now has desk job. Tries a diet for a few wks but gets frustrated and then eats. Is an "emotional/stress eater", likes carbs. Very frustrated with wt. Has started exercising 4-5 times wkly. Drinks alcohol a few days wkly. Doesn't drink sugared drinks.    Past Medical History:  Diagnosis Date   Acne    ADD (attention deficit disorder)    IBS (irritable bowel syndrome)    Mild depression    Vaccine for human papilloma virus (HPV) types 6, 11, 16, and 18 administered     Past Surgical History:  Procedure Laterality Date   elective abortion  07/2018    Family History  Problem Relation Age of Onset   Hypertension Father    Hypercholesterolemia Father    Hyperlipidemia Father    Heart attack Paternal Grandfather    Kidney cancer Other 4    Social History   Socioeconomic History   Marital status: Unknown    Spouse name: Not on file   Number of children: Not on file   Years of education: Not on file   Highest education level: Not on file  Occupational History    Not on file  Tobacco Use   Smoking status: Never   Smokeless tobacco: Never  Vaping Use   Vaping Use: Never used  Substance and Sexual Activity   Alcohol use: Yes    Comment: socially   Drug use: Not Currently   Sexual activity: Yes    Birth control/protection: OCP  Other Topics Concern   Not on file  Social History Narrative   ** Merged History Encounter **       Social Determinants of Health   Financial Resource Strain: Not on file  Food Insecurity: Not on file  Transportation Needs: Not on file  Physical Activity: Not on file  Stress: Not on file  Social Connections: Not on file  Intimate Partner Violence: Not on file    Outpatient Medications Prior to Visit  Medication Sig Dispense Refill   desogestrel-ethinyl estradiol (KARIVA) 0.15-0.02/0.01 MG (21/5) tablet Take 1 tablet by mouth daily. 84 tablet 3   No facility-administered medications prior to visit.    ROS:  Review of Systems  Constitutional:  Negative for fatigue, fever and unexpected weight change.  Respiratory:  Negative for cough, shortness of breath and wheezing.   Cardiovascular:  Negative for chest pain, palpitations and leg swelling.  Gastrointestinal:  Negative for blood in stool, constipation, diarrhea, nausea and vomiting.  Endocrine: Negative for cold  intolerance, heat intolerance and polyuria.  Genitourinary:  Negative for dyspareunia, dysuria, flank pain, frequency, genital sores, hematuria, menstrual problem, pelvic pain, urgency, vaginal bleeding, vaginal discharge and vaginal pain.  Musculoskeletal:  Negative for back pain, joint swelling and myalgias.  Skin:  Negative for rash.  Neurological:  Negative for dizziness, syncope, light-headedness, numbness and headaches.  Hematological:  Negative for adenopathy.  Psychiatric/Behavioral:  Positive for agitation. Negative for confusion, sleep disturbance and suicidal ideas. The patient is not nervous/anxious.    BREAST: No  symptoms   Objective: There were no vitals taken for this visit.   Physical Exam Constitutional:      Appearance: She is well-developed.  Genitourinary:     Vulva normal.     Right Labia: No rash, tenderness or lesions.    Left Labia: No tenderness, lesions or rash.    No vaginal discharge, erythema or tenderness.      Right Adnexa: not tender and no mass present.    Left Adnexa: not tender and no mass present.    No cervical friability or polyp.     Uterus is not enlarged or tender.  Breasts:    Right: No mass, nipple discharge, skin change or tenderness.     Left: No mass, nipple discharge, skin change or tenderness.  Neck:     Thyroid: No thyromegaly.  Cardiovascular:     Rate and Rhythm: Normal rate and regular rhythm.     Heart sounds: Normal heart sounds. No murmur heard. Pulmonary:     Effort: Pulmonary effort is normal.     Breath sounds: Normal breath sounds.  Abdominal:     Palpations: Abdomen is soft.     Tenderness: There is no abdominal tenderness. There is no guarding or rebound.  Musculoskeletal:        General: Normal range of motion.     Cervical back: Normal range of motion.  Lymphadenopathy:     Cervical: No cervical adenopathy.  Neurological:     General: No focal deficit present.     Mental Status: She is alert and oriented to person, place, and time.     Cranial Nerves: No cranial nerve deficit.  Skin:    General: Skin is warm and dry.  Psychiatric:        Mood and Affect: Mood normal.        Behavior: Behavior normal.        Thought Content: Thought content normal.        Judgment: Judgment normal.  Vitals reviewed.    No results found for this or any previous visit (from the past 24 hour(s)).    Assessment/Plan: Encounter for annual routine gynecological examination  Cervical cancer screening - Plan: Cytology - PAP  Encounter for surveillance of contraceptive pills - Plan: desogestrel-ethinyl estradiol (KARIVA) 0.15-0.02/0.01 MG  (21/5) tablet; OCP RF  Missed menses--neg UPT today. Reassurance. Check TSH anyway. Reassurance if neg. F/u prn.   Weight gain - Plan: Comprehensive metabolic panel, TSH; check labs. If neg, discussed diet/exercise changes for wt loss.   BMI 33.0-33.9,adult - Plan: Comprehensive metabolic panel, TSH  Thyroid disorder screening - Plan: TSH  No orders of the defined types were placed in this encounter.             GYN counsel adequate intake of calcium and vitamin D, diet and exercise     F/U  No follow-ups on file.  Nesanel Aguila B. Ayanah Snader, PA-C 05/27/2022 3:53 PM

## 2022-05-28 ENCOUNTER — Ambulatory Visit (INDEPENDENT_AMBULATORY_CARE_PROVIDER_SITE_OTHER): Payer: Medicaid Other | Admitting: Obstetrics and Gynecology

## 2022-05-28 ENCOUNTER — Encounter: Payer: Self-pay | Admitting: Obstetrics and Gynecology

## 2022-05-28 VITALS — BP 124/80 | Ht 68.0 in | Wt 207.0 lb

## 2022-05-28 DIAGNOSIS — Z01419 Encounter for gynecological examination (general) (routine) without abnormal findings: Secondary | ICD-10-CM

## 2022-05-28 DIAGNOSIS — Z13220 Encounter for screening for lipoid disorders: Secondary | ICD-10-CM

## 2022-05-28 DIAGNOSIS — Z3041 Encounter for surveillance of contraceptive pills: Secondary | ICD-10-CM | POA: Diagnosis not present

## 2022-05-28 DIAGNOSIS — Z0000 Encounter for general adult medical examination without abnormal findings: Secondary | ICD-10-CM

## 2022-05-28 MED ORDER — DESOGESTREL-ETHINYL ESTRADIOL 0.15-0.02/0.01 MG (21/5) PO TABS: 1.0000 | ORAL_TABLET | Freq: Every day | ORAL | 3 refills | Status: DC

## 2022-05-28 NOTE — Patient Instructions (Signed)
I value your feedback and you entrusting us with your care. If you get a [facility] patient survey, I would appreciate you taking the time to let us know about your experience today. Thank you! ? ? ?

## 2022-06-03 ENCOUNTER — Other Ambulatory Visit: Payer: Medicaid Other

## 2022-06-03 DIAGNOSIS — Z13220 Encounter for screening for lipoid disorders: Secondary | ICD-10-CM

## 2022-06-03 DIAGNOSIS — Z0000 Encounter for general adult medical examination without abnormal findings: Secondary | ICD-10-CM

## 2022-06-04 LAB — LIPID PANEL
Chol/HDL Ratio: 2.5 ratio (ref 0.0–4.4)
Cholesterol, Total: 198 mg/dL (ref 100–199)
HDL: 78 mg/dL (ref >39)
LDL Chol Calc (NIH): 100 mg/dL — ABNORMAL HIGH (ref 0–99)
Triglycerides: 114 mg/dL (ref 0–149)
VLDL Cholesterol Cal: 20 mg/dL (ref 5–40)

## 2022-06-29 HISTORY — PX: OTHER SURGICAL HISTORY: SHX169

## 2022-09-17 DIAGNOSIS — S92355D Nondisplaced fracture of fifth metatarsal bone, left foot, subsequent encounter for fracture with routine healing: Secondary | ICD-10-CM | POA: Diagnosis not present

## 2022-12-17 DIAGNOSIS — S92355D Nondisplaced fracture of fifth metatarsal bone, left foot, subsequent encounter for fracture with routine healing: Secondary | ICD-10-CM | POA: Diagnosis not present

## 2023-01-07 DIAGNOSIS — J029 Acute pharyngitis, unspecified: Secondary | ICD-10-CM | POA: Diagnosis not present

## 2023-01-07 DIAGNOSIS — J028 Acute pharyngitis due to other specified organisms: Secondary | ICD-10-CM | POA: Diagnosis not present

## 2023-01-13 DIAGNOSIS — M7611 Psoas tendinitis, right hip: Secondary | ICD-10-CM | POA: Diagnosis not present

## 2023-01-19 ENCOUNTER — Encounter: Payer: Self-pay | Admitting: Obstetrics and Gynecology

## 2023-01-19 ENCOUNTER — Other Ambulatory Visit (HOSPITAL_COMMUNITY)
Admission: RE | Admit: 2023-01-19 | Discharge: 2023-01-19 | Disposition: A | Discharge status: Home / Self Care | Payer: Medicaid Other | Source: Ambulatory Visit | Attending: Obstetrics and Gynecology | Admitting: Obstetrics and Gynecology

## 2023-01-19 ENCOUNTER — Ambulatory Visit (INDEPENDENT_AMBULATORY_CARE_PROVIDER_SITE_OTHER): Payer: Medicaid Other

## 2023-01-19 ENCOUNTER — Other Ambulatory Visit: Payer: Self-pay | Admitting: Obstetrics and Gynecology

## 2023-01-19 VITALS — BP 120/80 | Ht 68.0 in | Wt 214.0 lb

## 2023-01-19 DIAGNOSIS — Z113 Encounter for screening for infections with a predominantly sexual mode of transmission: Secondary | ICD-10-CM | POA: Diagnosis not present

## 2023-01-19 NOTE — Progress Notes (Unsigned)
    NURSE VISIT NOTE  Subjective:    Patient ID: Christina Hutchinson, female    DOB: 1991-01-02, 32 y.o.   MRN: [id number]  HPI  Patient is a 32 y.o. G62P0010 female who presents for self swab and lab work. She mainly wants to be checked for herpes, due to a possible exposure from 2-3 months ago. Denies abnormal vaginal bleeding or significant pelvic pain or fever.   Objective:    BP 120/80   Ht 5\' 8"  (1.727 m)   Wt 214 lb (97.1 kg)   LMP 12/24/2022 (Approximate)   BMI 32.54 kg/m      Assessment:   1. Screening for STD (sexually transmitted disease)      Plan:   A[facility]ima sent to lab. Labs drawn. Treatment: Will wait for results to be treated if needed. ROV prn if symptoms persist or worsen.   Donnetta Hail, CMA

## 2023-01-19 NOTE — Progress Notes (Signed)
Pt doing self swab STD testing with CMA; wants lab orders for all testing. Possible HSV exposure

## 2023-01-20 DIAGNOSIS — H10021 Other mucopurulent conjunctivitis, right eye: Secondary | ICD-10-CM | POA: Diagnosis not present

## 2023-01-20 DIAGNOSIS — Z6832 Body mass index (BMI) 32.0-32.9, adult: Secondary | ICD-10-CM | POA: Diagnosis not present

## 2023-01-20 LAB — HIV ANTIBODY (ROUTINE TESTING W REFLEX): HIV Screen 4th Generation wRfx: NONREACTIVE

## 2023-01-20 LAB — CERVICOVAGINAL ANCILLARY ONLY
Bacterial Vaginitis (gardnerella): NEGATIVE
Candida Glabrata: NEGATIVE
Candida Vaginitis: NEGATIVE
Chlamydia: NEGATIVE
Comment: NEGATIVE
Comment: NEGATIVE
Comment: NEGATIVE
Comment: NEGATIVE
Comment: NEGATIVE
Comment: NORMAL
Neisseria Gonorrhea: NEGATIVE
Trichomonas: NEGATIVE

## 2023-01-20 LAB — HEPATITIS C ANTIBODY: Hep C Virus Ab: NONREACTIVE

## 2023-01-20 LAB — RPR QUALITATIVE: RPR Ser Ql: NONREACTIVE

## 2023-01-20 LAB — HSV-2 AB, IGG: HSV 2 IgG, Type Spec: <0.91 index (ref 0.00–0.90)

## 2024-03-06 NOTE — Progress Notes (Unsigned)
 PCP:  Patient, No Pcp Per   No chief complaint on file.    HPI:      [name] is a 33 y.o. G0P0000 who LMP was No LMP recorded., presents today for her annual examination.  Her menses are regular every 28-30 days, lasting 5 days. Dysmenorrhea mild, occurring first 1-2 days of flow. She does not have intermenstrual bleeding.   Sex activity: single partner, contraception - OCPs. Pt had unintended pregnancy 12/19 with termination. Last Pap: 05/21/21 Results were: no abnormalities  Hx of STDs: none Yeast vag sx from 9/23 resolved after diflucan  tx.  There is no FH of breast cancer. There is no FH of ovarian cancer. The patient does do self-breast exams.  Tobacco use: The patient denies current or previous tobacco use. Alcohol use: occas No drug use.  Exercise: moderately active before broke LT foot, needs surgery  She does get adequate calcium and Vitamin D in her diet. Gardasil completed.  Would like lipid panel (normal CMP last yr). Strong pat hx of high chol/HTN.  Past Medical History:  Diagnosis Date   Acne    ADD (attention deficit disorder)    IBS (irritable bowel syndrome)    Mild depression    Vaccine for human papilloma virus (HPV) types 6, 11, 16, and 18 administered     Past Surgical History:  Procedure Laterality Date   elective abortion  07/2018    Family History  Problem Relation Age of Onset   Hypertension Father    Hypercholesterolemia Father    Hyperlipidemia Father    Heart attack Paternal Grandfather    Kidney cancer Other 30    Social History   Socioeconomic History   Marital status: Unknown    Spouse name: Not on file   Number of children: Not on file   Years of education: Not on file   Highest education level: Not on file  Occupational History   Not on file  Tobacco Use   Smoking status: Never   Smokeless tobacco: Never  Vaping Use   Vaping status: Never Used  Substance and Sexual Activity   Alcohol use: Yes     Comment: socially   Drug use: Not Currently   Sexual activity: Yes    Birth control/protection: None, Condom  Other Topics Concern   Not on file  Social History Narrative   ** Merged History Encounter **       Social Drivers of Corporate investment banker Strain: Not on file  Food Insecurity: Not on file  Transportation Needs: Not on file  Physical Activity: Not on file  Stress: Not on file  Social Connections: Not on file  Intimate Partner Violence: Not on file    Outpatient Medications Prior to Visit  Medication Sig Dispense Refill   desogestrel -ethinyl estradiol  (KARIVA ) 0.15-0.02/0.01 MG (21/5) tablet Take 1 tablet by mouth daily. (Patient not taking: Reported on 01/19/2023) 84 tablet 3   No facility-administered medications prior to visit.    ROS:  Review of Systems  Constitutional:  Negative for fatigue, fever and unexpected weight change.  Respiratory:  Negative for cough, shortness of breath and wheezing.   Cardiovascular:  Negative for chest pain, palpitations and leg swelling.  Gastrointestinal:  Negative for blood in stool, constipation, diarrhea, nausea and vomiting.  Endocrine: Negative for cold intolerance, heat intolerance and polyuria.  Genitourinary:  Negative for dyspareunia, dysuria, flank pain, frequency, genital sores, hematuria, menstrual problem, pelvic pain, urgency, vaginal bleeding, vaginal discharge  and vaginal pain.  Musculoskeletal:  Negative for back pain, joint swelling and myalgias.  Skin:  Negative for rash.  Neurological:  Negative for dizziness, syncope, light-headedness, numbness and headaches.  Hematological:  Negative for adenopathy.  Psychiatric/Behavioral:  Negative for agitation, confusion, sleep disturbance and suicidal ideas. The patient is not nervous/anxious.    BREAST: No symptoms   Objective: There were no vitals taken for this visit.   Physical Exam Constitutional:      Appearance: She is well-developed.   Genitourinary:     Vulva normal.     Right Labia: No rash, tenderness or lesions.    Left Labia: No tenderness, lesions or rash.    No vaginal discharge, erythema or tenderness.      Right Adnexa: not tender and no mass present.    Left Adnexa: not tender and no mass present.    No cervical friability or polyp.     Uterus is not enlarged or tender.  Breasts:    Right: No mass, nipple discharge, skin change or tenderness.     Left: No mass, nipple discharge, skin change or tenderness.  Neck:     Thyroid: No thyromegaly.  Cardiovascular:     Rate and Rhythm: Normal rate and regular rhythm.     Heart sounds: Normal heart sounds. No murmur heard. Pulmonary:     Effort: Pulmonary effort is normal.     Breath sounds: Normal breath sounds.  Abdominal:     Palpations: Abdomen is soft.     Tenderness: There is no abdominal tenderness. There is no guarding or rebound.  Musculoskeletal:        General: Normal range of motion.     Cervical back: Normal range of motion.  Lymphadenopathy:     Cervical: No cervical adenopathy.  Neurological:     General: No focal deficit present.     Mental Status: She is alert and oriented to person, place, and time.     Cranial Nerves: No cranial nerve deficit.  Skin:    General: Skin is warm and dry.  Psychiatric:        Mood and Affect: Mood normal.        Behavior: Behavior normal.        Thought Content: Thought content normal.        Judgment: Judgment normal.  Vitals reviewed.     Assessment/Plan: Encounter for annual routine gynecological examination  Encounter for surveillance of contraceptive pills - Plan: desogestrel -ethinyl estradiol  (KARIVA ) 0.15-0.02/0.01 MG (21/5) tablet; OCP RF  Blood tests for routine general physical examination - Plan: Lipid panel  Screening cholesterol level - Plan: Lipid panel   No orders of the defined types were placed in this encounter.             GYN counsel adequate intake of calcium and  vitamin D, diet and exercise     F/U  No follow-ups on file.  Cystal Shannahan B. Gionni Vaca, PA-C 03/06/2024 4:50 PM

## 2024-03-07 ENCOUNTER — Encounter: Payer: Self-pay | Admitting: Obstetrics and Gynecology

## 2024-03-07 ENCOUNTER — Ambulatory Visit: Admitting: Obstetrics and Gynecology

## 2024-03-07 ENCOUNTER — Other Ambulatory Visit (HOSPITAL_COMMUNITY)
Admission: RE | Admit: 2024-03-07 | Discharge: 2024-03-07 | Disposition: A | Discharge status: Home / Self Care | Source: Ambulatory Visit | Attending: Obstetrics and Gynecology | Admitting: Obstetrics and Gynecology

## 2024-03-07 VITALS — BP 111/71 | HR 83 | Ht 68.0 in | Wt 184.0 lb

## 2024-03-07 DIAGNOSIS — Z124 Encounter for screening for malignant neoplasm of cervix: Secondary | ICD-10-CM | POA: Diagnosis not present

## 2024-03-07 DIAGNOSIS — N898 Other specified noninflammatory disorders of vagina: Secondary | ICD-10-CM | POA: Diagnosis not present

## 2024-03-07 DIAGNOSIS — Z1151 Encounter for screening for human papillomavirus (HPV): Secondary | ICD-10-CM | POA: Diagnosis not present

## 2024-03-07 DIAGNOSIS — Z3041 Encounter for surveillance of contraceptive pills: Secondary | ICD-10-CM

## 2024-03-07 DIAGNOSIS — Z01411 Encounter for gynecological examination (general) (routine) with abnormal findings: Secondary | ICD-10-CM

## 2024-03-07 DIAGNOSIS — Z01419 Encounter for gynecological examination (general) (routine) without abnormal findings: Secondary | ICD-10-CM

## 2024-03-07 DIAGNOSIS — Z3009 Encounter for other general counseling and advice on contraception: Secondary | ICD-10-CM

## 2024-03-07 MED ORDER — PHEXXI 1.8-1-0.4 % VA GEL: VAGINAL | 3 refills | Status: AC

## 2024-03-07 NOTE — Patient Instructions (Signed)
 I value your feedback and you entrusting [place] with your care. If you get a [facility] patient survey, I would appreciate you taking the time to let us know about your experience today. Thank you! ? ? ?

## 2024-03-12 LAB — CYTOLOGY - PAP
Adequacy: ABSENT
Comment: NEGATIVE
Diagnosis: NEGATIVE
High risk HPV: NEGATIVE
